# Patient Record
Sex: Male | Born: 1970 | Race: Asian | Hispanic: No | Marital: Married
Health system: Southern US, Community
[De-identification: ages and names within clinical notes are randomized; demographics above are authoritative.]

---

## 2010-09-12 ENCOUNTER — Emergency Department (HOSPITAL_COMMUNITY): Payer: Worker's Compensation

## 2010-09-12 ENCOUNTER — Emergency Department (HOSPITAL_COMMUNITY)
Admission: EM | Admit: 2010-09-12 | Discharge: 2010-09-12 | Disposition: A | Discharge status: Home / Self Care | Payer: Worker's Compensation | Attending: Emergency Medicine | Admitting: Emergency Medicine

## 2010-09-12 DIAGNOSIS — M5430 Sciatica, unspecified side: Secondary | ICD-10-CM | POA: Insufficient documentation

## 2010-09-12 DIAGNOSIS — M545 Low back pain, unspecified: Secondary | ICD-10-CM | POA: Insufficient documentation

## 2012-05-31 IMAGING — CR DG LUMBAR SPINE COMPLETE 4+V
5 series · 5 of 5 positions shown · non-contrast
Comparison: None.

CLINICAL DATA: Right-sided lower back pain extending down the right
leg after weight lifting.

LUMBAR SPINE - COMPLETE 4+ VIEW

[t l-spine a.p.]
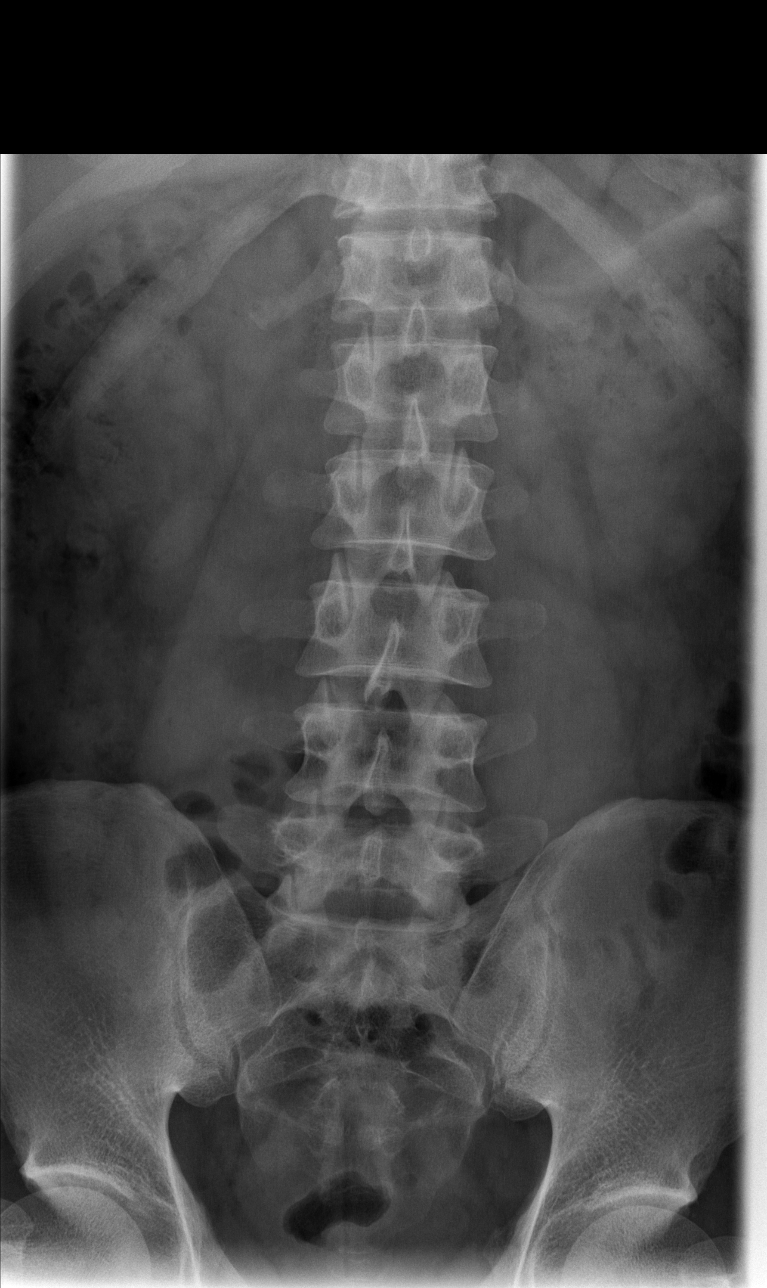

[t l-spine oblique exposure (1 of 2)]
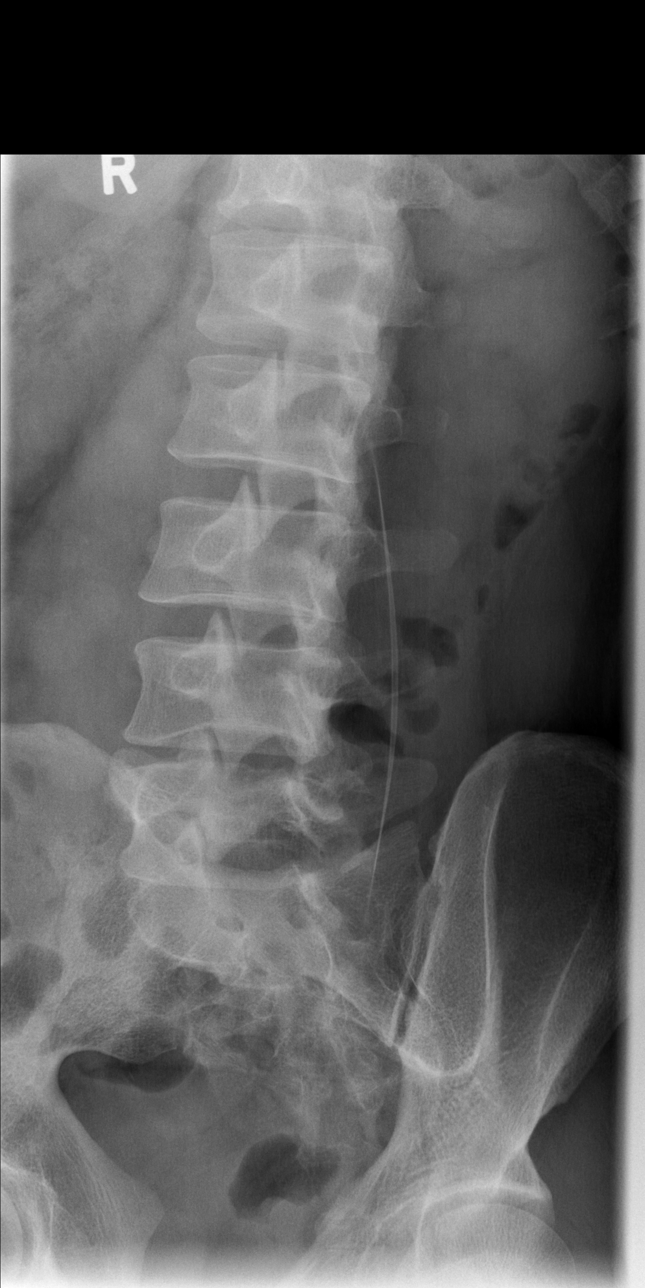

[t l-spine oblique exposure (2 of 2)]
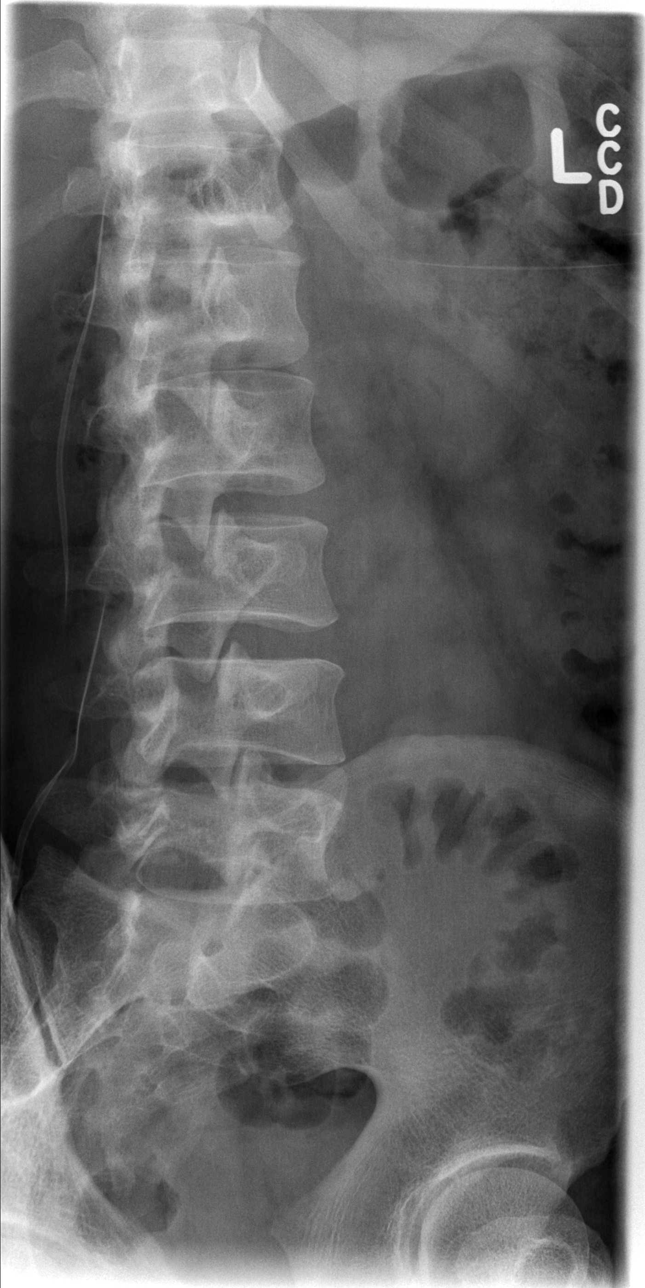

[t l-spine lat]
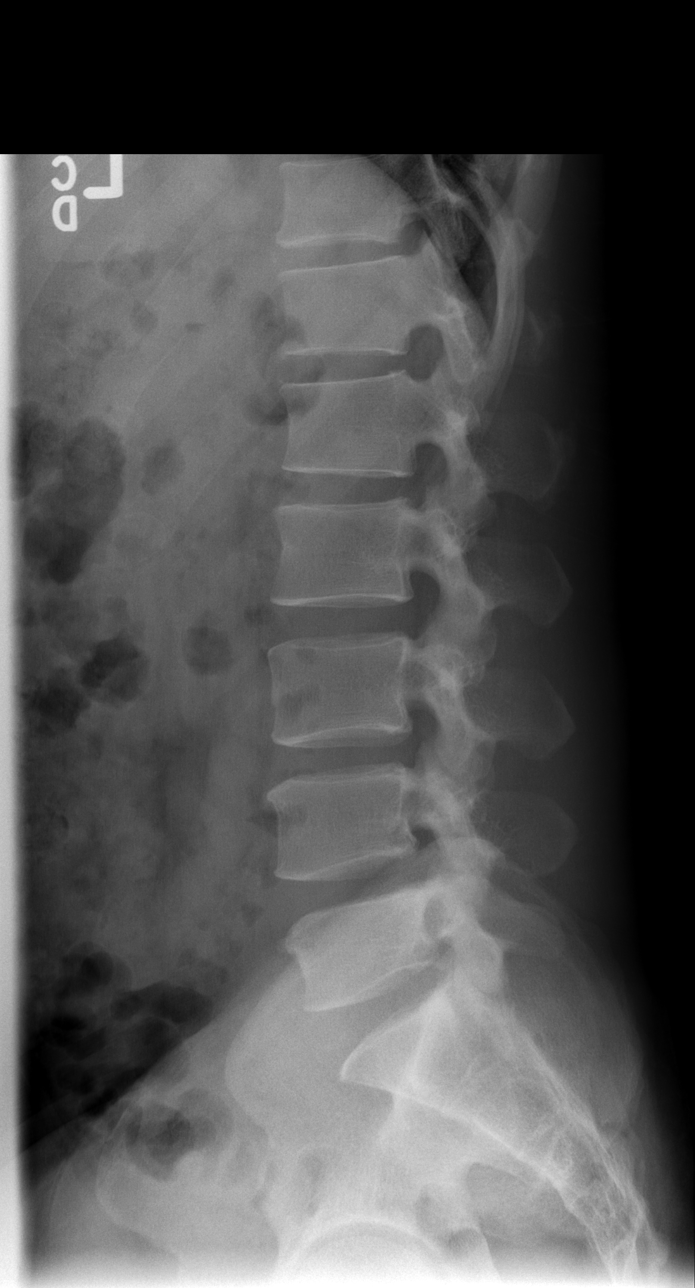

[t l-spine l5-s1 spot]
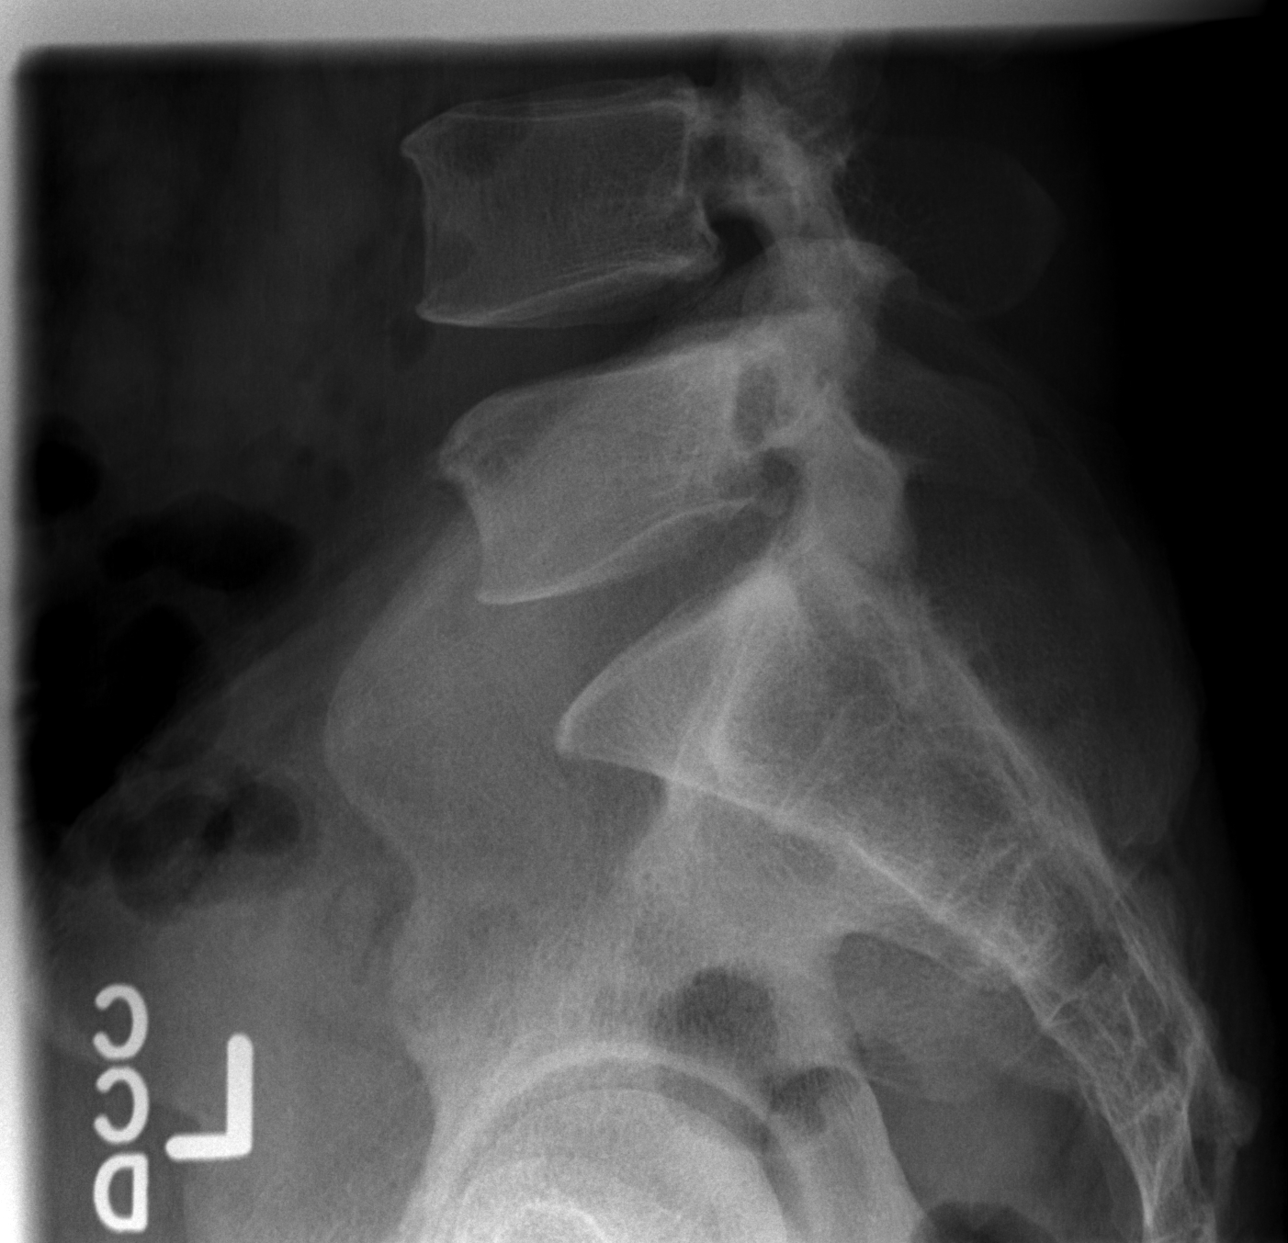

[5 of 5 positions shown; findings below may reference images not displayed]

FINDINGS: There are diminutive ribs at T12. There are five non-rib
bearing vertebral bodies.  There is normal alignment.  There is no
evidence for acute fracture or subluxation.  No spondylolysis or
spondylolisthesis identified. No significant degenerative changes
identified. The visualized portion of the pelvis has a normal
appearance.  Visualized bowel gas pattern is nonobstructive.
IMPRESSION: Negative exam.

## 2014-08-02 ENCOUNTER — Ambulatory Visit: Payer: 59

## 2019-07-26 ENCOUNTER — Ambulatory Visit: Payer: Self-pay | Attending: Internal Medicine

## 2019-07-26 DIAGNOSIS — Z23 Encounter for immunization: Secondary | ICD-10-CM

## 2019-07-26 NOTE — Progress Notes (Signed)
   Covid-[facility] Vaccination Clinic  Name:  Zandyr Barnhill    MRN: [id number] DOB: [birth year]  07/26/2019  Mr. Flamenco was observed post Covid-19 immunization for 15 minutes without incident. He was provided with Vaccine Information Sheet and instruction to access the V-Safe system.   Mr. Toner was instructed to call 911 with any severe reactions post vaccine: Marland Kitchen Difficulty breathing  . Swelling of face and throat  . A fast heartbeat  . A bad rash all over body  . Dizziness and weakness   Immunizations Administered    Name Date Dose VIS Date Route   Moderna COVID-19 Vaccine 07/26/2019  1:20 PM 0.5 mL 03/2019 Intramuscular   Manufacturer: Moderna   LotMadilyn Hook   NDC: 81157-262-03

## 2019-08-23 ENCOUNTER — Ambulatory Visit: Payer: Self-pay | Attending: Internal Medicine

## 2019-08-23 DIAGNOSIS — Z23 Encounter for immunization: Secondary | ICD-10-CM

## 2019-08-23 NOTE — Progress Notes (Signed)
   Covid-19 Vaccination Clinic  Name:  Jeffrey Weiss    MRN: [id number] DOB: July 28, 1970  08/23/2019  Jeffrey Weiss was observed post Covid-19 immunization for 15 minutes without incident. He was provided with Vaccine Information Sheet and instruction to access the V-Safe system.   Jeffrey Weiss was instructed to call 911 with any severe reactions post vaccine: Marland Kitchen Difficulty breathing  . Swelling of face and throat  . A fast heartbeat  . A bad rash all over body  . Dizziness and weakness   Immunizations Administered    Name Date Dose VIS Date Route   Moderna COVID-19 Vaccine 08/23/2019  1:24 PM 0.5 mL 03/2019 Intramuscular   Manufacturer: Moderna   Lot: [id number]   NDC: 49611-643-53
